# Patient Record
Sex: Female | Born: 1960 | Race: White | Hispanic: Yes | Smoking: Never smoker
Health system: Southern US, Community
[De-identification: ages and names within clinical notes are randomized; demographics above are authoritative.]

## PROBLEM LIST (undated history)

## (undated) DIAGNOSIS — J45909 Unspecified asthma, uncomplicated: Secondary | ICD-10-CM

## (undated) HISTORY — PX: BREAST BIOPSY: SHX20

## (undated) HISTORY — PX: COLONOSCOPY W/ POLYPECTOMY: SHX1380

## (undated) HISTORY — PX: BREAST REDUCTION SURGERY: SHX8

## (undated) HISTORY — PX: TUMOR REMOVAL: SHX12

## (undated) HISTORY — PX: TONSILLECTOMY: SUR1361

## (undated) HISTORY — PX: OTHER SURGICAL HISTORY: SHX169

---

## 1999-02-28 ENCOUNTER — Other Ambulatory Visit: Admission: RE | Admit: 1999-02-28 | Discharge: 1999-02-28 | Payer: Self-pay | Admitting: Obstetrics and Gynecology

## 1999-03-24 ENCOUNTER — Encounter: Payer: Self-pay | Admitting: Obstetrics and Gynecology

## 1999-03-24 ENCOUNTER — Ambulatory Visit (HOSPITAL_COMMUNITY): Admission: RE | Admit: 1999-03-24 | Discharge: 1999-03-24 | Payer: Self-pay | Admitting: Obstetrics and Gynecology

## 2000-04-21 ENCOUNTER — Other Ambulatory Visit: Admission: RE | Admit: 2000-04-21 | Discharge: 2000-04-21 | Payer: Self-pay | Admitting: Obstetrics and Gynecology

## 2000-08-16 ENCOUNTER — Encounter (INDEPENDENT_AMBULATORY_CARE_PROVIDER_SITE_OTHER): Payer: Self-pay | Admitting: Specialist

## 2000-08-16 ENCOUNTER — Ambulatory Visit (HOSPITAL_COMMUNITY): Admission: RE | Admit: 2000-08-16 | Discharge: 2000-08-16 | Payer: Self-pay | Admitting: Gastroenterology

## 2001-03-24 ENCOUNTER — Other Ambulatory Visit: Admission: RE | Admit: 2001-03-24 | Discharge: 2001-03-24 | Payer: Self-pay | Admitting: Obstetrics and Gynecology

## 2001-03-24 ENCOUNTER — Encounter (INDEPENDENT_AMBULATORY_CARE_PROVIDER_SITE_OTHER): Payer: Self-pay

## 2001-04-19 ENCOUNTER — Other Ambulatory Visit: Admission: RE | Admit: 2001-04-19 | Discharge: 2001-04-19 | Payer: Self-pay | Admitting: Obstetrics and Gynecology

## 2002-05-08 ENCOUNTER — Other Ambulatory Visit: Admission: RE | Admit: 2002-05-08 | Discharge: 2002-05-08 | Payer: Self-pay | Admitting: Obstetrics and Gynecology

## 2003-04-27 ENCOUNTER — Ambulatory Visit (HOSPITAL_COMMUNITY): Admission: RE | Admit: 2003-04-27 | Discharge: 2003-04-27 | Payer: Self-pay | Admitting: Obstetrics and Gynecology

## 2003-04-27 ENCOUNTER — Encounter: Payer: Self-pay | Admitting: Obstetrics and Gynecology

## 2003-05-25 ENCOUNTER — Other Ambulatory Visit: Admission: RE | Admit: 2003-05-25 | Discharge: 2003-05-25 | Payer: Self-pay | Admitting: Obstetrics and Gynecology

## 2003-10-27 HISTORY — PX: ABLATION: SHX5711

## 2004-02-17 ENCOUNTER — Emergency Department (HOSPITAL_COMMUNITY): Admission: EM | Admit: 2004-02-17 | Discharge: 2004-02-17 | Payer: Self-pay | Admitting: Family Medicine

## 2004-05-07 ENCOUNTER — Ambulatory Visit (HOSPITAL_COMMUNITY): Admission: RE | Admit: 2004-05-07 | Discharge: 2004-05-07 | Payer: Self-pay | Admitting: Obstetrics and Gynecology

## 2004-08-05 ENCOUNTER — Other Ambulatory Visit: Admission: RE | Admit: 2004-08-05 | Discharge: 2004-08-05 | Payer: Self-pay | Admitting: Obstetrics and Gynecology

## 2004-10-22 ENCOUNTER — Ambulatory Visit (HOSPITAL_COMMUNITY): Admission: RE | Admit: 2004-10-22 | Discharge: 2004-10-22 | Payer: Self-pay | Admitting: Obstetrics and Gynecology

## 2004-10-22 ENCOUNTER — Ambulatory Visit (HOSPITAL_BASED_OUTPATIENT_CLINIC_OR_DEPARTMENT_OTHER): Admission: RE | Admit: 2004-10-22 | Discharge: 2004-10-22 | Payer: Self-pay | Admitting: Obstetrics and Gynecology

## 2004-12-07 ENCOUNTER — Emergency Department (HOSPITAL_COMMUNITY): Admission: EM | Admit: 2004-12-07 | Discharge: 2004-12-07 | Payer: Self-pay | Admitting: Family Medicine

## 2005-06-02 ENCOUNTER — Ambulatory Visit (HOSPITAL_COMMUNITY): Admission: RE | Admit: 2005-06-02 | Discharge: 2005-06-02 | Payer: Self-pay | Admitting: Obstetrics and Gynecology

## 2006-08-03 ENCOUNTER — Ambulatory Visit (HOSPITAL_COMMUNITY): Admission: RE | Admit: 2006-08-03 | Discharge: 2006-08-03 | Payer: Self-pay | Admitting: Obstetrics and Gynecology

## 2007-08-09 ENCOUNTER — Ambulatory Visit (HOSPITAL_COMMUNITY): Admission: RE | Admit: 2007-08-09 | Discharge: 2007-08-09 | Payer: Self-pay | Admitting: Obstetrics and Gynecology

## 2007-10-27 HISTORY — PX: TUBAL LIGATION: SHX77

## 2008-08-15 ENCOUNTER — Other Ambulatory Visit: Admission: RE | Admit: 2008-08-15 | Discharge: 2008-08-15 | Payer: Self-pay | Admitting: Obstetrics and Gynecology

## 2008-08-15 ENCOUNTER — Ambulatory Visit (HOSPITAL_COMMUNITY): Admission: RE | Admit: 2008-08-15 | Discharge: 2008-08-15 | Payer: Self-pay | Admitting: Obstetrics and Gynecology

## 2008-09-17 ENCOUNTER — Ambulatory Visit (HOSPITAL_BASED_OUTPATIENT_CLINIC_OR_DEPARTMENT_OTHER): Admission: RE | Admit: 2008-09-17 | Discharge: 2008-09-17 | Payer: Self-pay | Admitting: Obstetrics and Gynecology

## 2009-08-22 ENCOUNTER — Ambulatory Visit (HOSPITAL_COMMUNITY): Admission: RE | Admit: 2009-08-22 | Discharge: 2009-08-22 | Payer: Self-pay | Admitting: Obstetrics and Gynecology

## 2010-11-10 ENCOUNTER — Ambulatory Visit (HOSPITAL_COMMUNITY)
Admission: RE | Admit: 2010-11-10 | Discharge: 2010-11-10 | Payer: Self-pay | Source: Home / Self Care | Attending: Obstetrics and Gynecology | Admitting: Obstetrics and Gynecology

## 2011-03-10 NOTE — Op Note (Signed)
[name], [name]                ACCOUNT NO.:  [id number]   MEDICAL RECORD NO.:  [id number]          PATIENT TYPE:  AMB   LOCATION:  [facility]                         FACILITY:  [facility]   PHYSICIAN:  Cynthia P. Romine, M.D.DATE OF BIRTH:  April 12, 1961   DATE OF PROCEDURE:  09/17/2008  DATE OF DISCHARGE:                               OPERATIVE REPORT   PREOPERATIVE DIAGNOSIS:  Desire for attempt at permanent surgical  sterilization.   POSTOPERATIVE DIAGNOSIS:  Desire for attempt at permanent surgical  sterilization.   PROCEDURE:  Laparoscopic Falope ring bilateral tubal sterilization.   SURGEON:  Dr. Arline Asp Romine.   ANESTHESIA:  General endotracheal.   ESTIMATED BLOOD LOSS:  Minimal.   COMPLICATIONS:  None.   PROCEDURE:  The patient was taken to the operating room, and after  induction of adequate general endotracheal anesthesia was placed in  dorsal lithotomy position and prepped and draped in the usual fashion.  A speculum was inserted.  The cervix was grasped on its anterior lip  with a single-tooth tenaculum, and a Hulka uterine manipulator was  placed.  The speculum was removed.  The bladder was drained with a red  rubber catheter.  Attention was next turned to the abdomen.  A crease  was noted vertically just below the umbilicus and that seemed to be a  good choice for the incision.  It was infiltrated with 0.25% Marcaine,  and approximately 10-mm incision was made in the skin.  A Veress needle  was inserted in the peritoneal space.  Proper placement was tested by  noting free flow of saline through the Veress needle with a negative  aspirate and then by noting the response of a drop of saline placed at  the hub of the Veress needle to negative pressure as the abdominal wall  was elevated.  Pneumoperitoneum was created with 2.5 liters of CO2 using  the automatic insufflator with a maximum pressure set on 12 mmHg.  The  disposable 11-mm trocar was then inserted and its proper  placement noted  with the microscope.  The pelvis was inspected.  The uterus appeared  normal as did the tubes and ovaries.  The anterior cul-de-sac was  normal.  In the posterior cul-de-sac on the right uterosacral ligament,  there was a small powder burn area consistent with endometriosis.  Photographic documentation was taken.  The upper abdomen appeared  normal.  The liver edge was smoothed.  Attention was next turned to the  suprapubic area.  An approximately 8-mm suprapubic incision was made  after infiltrating with 0.25% Marcaine.  The 8-mm trocar was inserted  under direct visualization.  The Falope ring applier was placed.  The  right tube was traced to its fimbriated end.  An isthmic portion was  elevated, and a Falope ring was placed.  A good knuckle of tube was  noted to be contained within the ring, and good blanching was noted.  The procedure was repeated on the patient's left, identifying the tube  and tracing it to its fimbriated end.  An isthmic portion was elevated,  and a Falope ring  was placed.  Again, a good knuckle of tube was noted  to be contained within the ring, and good blanching was noted.  Photographic documentation was taken.  The applier was then removed.  Pneumoperitoneum was allowed to escape.  The sleeves were removed, and  the incisions were closed subcuticularly  with 4-0 Vicryl Rapid and then Dermabond applied to the skin.  The  manipulator was removed from the uterus, and the procedure was  terminated.  The patient tolerated it well in satisfactory condition to  post-anesthesia recovery.  Sponge, needle, and instrument counts were  correct.      Cynthia P. Romine, M.D.  Electronically Signed     CPR/MEDQ  D:  09/17/2008  T:  09/17/2008  Job:  [id number]

## 2011-03-13 NOTE — Procedures (Signed)
[facility]  Patient:    [name], [name]                       MRN: [id number] Proc. Date: 08/16/00 Adm. Date:  [id number] Attending:  Rich Brave CC:         Dellis Anes. Idell Pickles, M.D.   Procedure Report  PROCEDURE PERFORMED:  Colonoscopy with biopsies.  ENDOSCOPIST:  Florencia Reasons, M.D.  INDICATIONS FOR PROCEDURE:  The patient is a 50 year old female with recurrent small volume hematochezia.  FINDINGS:  Distal proctitis suggestive of ulcerative proctitis.  DESCRIPTION OF PROCEDURE:  The nature, purpose and risks of the procedure had been discussed with the patient, who provided written consent.  Sedation was fentanyl 90 mcg and Versed 8 mg prior to and during the course of procedure without arrhythmias or desaturation.  Perianal and digital rectal exams were unremarkable.  The Olympus adult video colonoscope was advanced quite easily to the terminal ileum which was entered for a distance of about 10 to 12 cm, after which pullback was performed.   The quality of the prep was very good except for some pasty stool pocketed in parts of the proximal ascending colon just above the cecum.  I did try to irrigate the stool to some degree and felt that I was able to see behind most of those areas adequately, such that no significant lesions would have been missed.  The main finding on this exam was distal proctitis, characterized by erythema, decreased vascularity and some slight granularity without exudate.  This appeared to extend from the anal verge up to about 4 cm.  Above this, the rectal mucosa looked normal,  No polyps, cancer, proximal colitis, ileitis, vascular malformations or diverticular disease were observed.  Retroflexion in the rectum basically confirmed the above impression.  Biopsies were obtained from the proximal rectum for comparison and from the inflamed segment of the distal rectum prior to removal of the  scope.  Pullout through the anal canal showed some mild to moderate internal hemorrhoids, nothing particularly impressive and nothing that would readily account for the patients bleeding.  PLAN: 1. Initiate mesalamine suppositories as previously recommended. 2. Await pathology on biopsies. DD:  08/16/00 TD:  08/16/00 Job: [id number]

## 2011-03-13 NOTE — Op Note (Signed)
[name], [name]                ACCOUNT NO.:  [id number]   MEDICAL RECORD NO.:  [id number]          PATIENT TYPE:  AMB   LOCATION:  [facility]                         [facility]ACILITY:  [facility]   PHYSICIAN:  Cynthia P. Romine, M.D.DATE OF BIRTH:  1961-06-15   DATE OF PROCEDURE:  10/22/2004  DATE OF DISCHARGE:                                 OPERATIVE REPORT   PREOPERATIVE DIAGNOSES:  Menorrhagia.   POSTOPERATIVE DIAGNOSES:  Menorrhagia.   PROCEDURE:  Endometrial ablation with hydrotherm ablator.   SURGEON:  Cynthia P. Romine, M.D.   ANESTHESIA:  General by LMA.   ESTIMATED BLOOD LOSS:  Minimal.   COMPLICATIONS:  None.   DESCRIPTION OF PROCEDURE:  The patient was taken to the operating room and  after the induction of the adequate general anesthesia was placed in the  dorsal lithotomy position and prepped and draped in the usual fashion. The  bladder was drained with a red rubber catheter. A posterior weighted and  anterior Simms retractor were placed, the cervix was grasped on its anterior  lip with a single tooth tenaculum. The uterus was sounded to 8 cm. The  cervix was dilated to a #23 Shawnie Pons. The hysteroscope was introduced and  photographic documentation was taken of the endometrial cavity which  appeared clean. The scope was withdrawn to just inside the internal cervical  os and hydrotherm ablation was carried out in the usual fashion without  difficulty. Upon completion of the procedure, photographic documentation  again was taken of the endometrial cavity. The scope was withdrawn and the  procedure was terminated. The instruments were removed from the vagina and  the patient was taken to the recovery room in satisfactory condition.      CPR/MEDQ  D:  10/22/2004  T:  10/22/2004  Job:  [id number]

## 2011-07-28 LAB — CBC
HCT: 44
Hemoglobin: 14.8
MCV: 89.7
Platelets: 252
RBC: 4.9
RDW: 14.1
WBC: 7.5

## 2011-07-28 LAB — HCG, SERUM, QUALITATIVE: Preg, Serum: NEGATIVE

## 2011-11-17 ENCOUNTER — Other Ambulatory Visit: Payer: Self-pay | Admitting: Obstetrics and Gynecology

## 2011-11-17 DIAGNOSIS — Z1231 Encounter for screening mammogram for malignant neoplasm of breast: Secondary | ICD-10-CM

## 2011-11-18 ENCOUNTER — Other Ambulatory Visit: Payer: Self-pay | Admitting: Gastroenterology

## 2011-11-18 DIAGNOSIS — R634 Abnormal weight loss: Secondary | ICD-10-CM

## 2011-11-23 ENCOUNTER — Other Ambulatory Visit: Payer: Self-pay | Admitting: Gastroenterology

## 2011-12-01 ENCOUNTER — Ambulatory Visit
Admission: RE | Admit: 2011-12-01 | Discharge: 2011-12-01 | Disposition: A | Discharge status: Home / Self Care | Payer: BC Managed Care – PPO | Source: Ambulatory Visit | Attending: Gastroenterology | Admitting: Gastroenterology

## 2011-12-01 ENCOUNTER — Ambulatory Visit
Admission: RE | Admit: 2011-12-01 | Discharge: 2011-12-01 | Disposition: A | Discharge status: Home / Self Care | Payer: BC Managed Care – PPO | Source: Ambulatory Visit | Attending: Obstetrics and Gynecology | Admitting: Obstetrics and Gynecology

## 2011-12-01 DIAGNOSIS — Z1231 Encounter for screening mammogram for malignant neoplasm of breast: Secondary | ICD-10-CM

## 2011-12-01 DIAGNOSIS — R634 Abnormal weight loss: Secondary | ICD-10-CM

## 2011-12-01 MED ORDER — IOHEXOL 300 MG/ML  SOLN
100.0000 mL | Freq: Once | INTRAMUSCULAR | Status: AC | PRN
  Administered 2011-12-01: 100 mL via INTRAVENOUS

## 2011-12-08 ENCOUNTER — Other Ambulatory Visit: Payer: Self-pay | Admitting: Obstetrics and Gynecology

## 2011-12-08 DIAGNOSIS — R928 Other abnormal and inconclusive findings on diagnostic imaging of breast: Secondary | ICD-10-CM

## 2011-12-15 ENCOUNTER — Ambulatory Visit
Admission: RE | Admit: 2011-12-15 | Discharge: 2011-12-15 | Disposition: A | Discharge status: Home / Self Care | Payer: BC Managed Care – PPO | Source: Ambulatory Visit | Attending: Obstetrics and Gynecology | Admitting: Obstetrics and Gynecology

## 2011-12-15 DIAGNOSIS — R928 Other abnormal and inconclusive findings on diagnostic imaging of breast: Secondary | ICD-10-CM

## 2012-01-25 HISTORY — PX: COLPOSCOPY: SHX161

## 2012-02-15 HISTORY — PX: LEEP: SHX91

## 2012-07-27 ENCOUNTER — Ambulatory Visit: Payer: BC Managed Care – PPO | Attending: Orthopedic Surgery | Admitting: Physical Therapy

## 2012-07-27 DIAGNOSIS — M25539 Pain in unspecified wrist: Secondary | ICD-10-CM | POA: Insufficient documentation

## 2012-07-27 DIAGNOSIS — M6281 Muscle weakness (generalized): Secondary | ICD-10-CM | POA: Insufficient documentation

## 2012-07-27 DIAGNOSIS — IMO0001 Reserved for inherently not codable concepts without codable children: Secondary | ICD-10-CM | POA: Insufficient documentation

## 2012-08-02 ENCOUNTER — Ambulatory Visit: Payer: BC Managed Care – PPO | Admitting: Physical Therapy

## 2012-08-10 ENCOUNTER — Ambulatory Visit: Payer: BC Managed Care – PPO | Admitting: Physical Therapy

## 2012-08-17 ENCOUNTER — Ambulatory Visit: Payer: BC Managed Care – PPO | Admitting: Physical Therapy

## 2012-08-24 ENCOUNTER — Encounter: Payer: BC Managed Care – PPO | Admitting: Physical Therapy

## 2012-08-31 ENCOUNTER — Ambulatory Visit: Payer: BC Managed Care – PPO | Attending: Orthopedic Surgery | Admitting: Physical Therapy

## 2012-08-31 DIAGNOSIS — M25539 Pain in unspecified wrist: Secondary | ICD-10-CM | POA: Insufficient documentation

## 2012-08-31 DIAGNOSIS — IMO0001 Reserved for inherently not codable concepts without codable children: Secondary | ICD-10-CM | POA: Insufficient documentation

## 2012-08-31 DIAGNOSIS — M6281 Muscle weakness (generalized): Secondary | ICD-10-CM | POA: Insufficient documentation

## 2012-09-07 ENCOUNTER — Encounter: Payer: BC Managed Care – PPO | Admitting: Physical Therapy

## 2012-11-22 ENCOUNTER — Other Ambulatory Visit: Payer: Self-pay | Admitting: Obstetrics and Gynecology

## 2012-11-22 DIAGNOSIS — Z1231 Encounter for screening mammogram for malignant neoplasm of breast: Secondary | ICD-10-CM

## 2012-12-08 ENCOUNTER — Ambulatory Visit: Payer: BC Managed Care – PPO

## 2012-12-09 ENCOUNTER — Ambulatory Visit (INDEPENDENT_AMBULATORY_CARE_PROVIDER_SITE_OTHER): Payer: BC Managed Care – PPO

## 2012-12-09 DIAGNOSIS — Z1231 Encounter for screening mammogram for malignant neoplasm of breast: Secondary | ICD-10-CM

## 2013-01-16 ENCOUNTER — Encounter: Payer: Self-pay | Admitting: Certified Nurse Midwife

## 2013-01-17 ENCOUNTER — Ambulatory Visit (INDEPENDENT_AMBULATORY_CARE_PROVIDER_SITE_OTHER): Payer: BC Managed Care – PPO | Admitting: Certified Nurse Midwife

## 2013-01-17 ENCOUNTER — Encounter: Payer: Self-pay | Admitting: Certified Nurse Midwife

## 2013-01-17 ENCOUNTER — Other Ambulatory Visit: Payer: Self-pay | Admitting: Certified Nurse Midwife

## 2013-01-17 VITALS — BP 110/64

## 2013-01-17 DIAGNOSIS — N898 Other specified noninflammatory disorders of vagina: Secondary | ICD-10-CM

## 2013-01-17 DIAGNOSIS — B977 Papillomavirus as the cause of diseases classified elsewhere: Secondary | ICD-10-CM

## 2013-01-17 DIAGNOSIS — N760 Acute vaginitis: Secondary | ICD-10-CM

## 2013-01-17 NOTE — Patient Instructions (Addendum)

## 2013-01-18 ENCOUNTER — Ambulatory Visit: Payer: Self-pay | Admitting: Certified Nurse Midwife

## 2013-01-18 DIAGNOSIS — N760 Acute vaginitis: Secondary | ICD-10-CM | POA: Insufficient documentation

## 2013-01-18 DIAGNOSIS — B977 Papillomavirus as the cause of diseases classified elsewhere: Secondary | ICD-10-CM | POA: Insufficient documentation

## 2013-01-18 LAB — POCT WET PREP (WET MOUNT)

## 2013-01-18 NOTE — Procedures (Signed)
52 y.o. Divorced [place] female  OB History   Grav Para Term Preterm Abortions TAB SAB Ect Mult Living   3 1 1  2 2    1      here for colposcopy. 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  Contraception:  bilateral tubal ligation   Blood pressure 110/64, last menstrual period 01/07/2013.  Procedure explained and patient's questions were invited and answered.  Consent form signed.    Role of HPV in genesis of SIL discussed with patient, and questions answered.   Speculum inserted atraumatically and cervix visualized. Wet prep obtained.  Saline wash applied.  3% acetic acid applied with acetowhite effect noted . Cervix examined using 3.75,7.5,15  X magnification and green filter.    Gross appearance:Leep appearance no other abnormal appearance  Squamocolumnar junction seen in entirety:yes  Extent of lesion entirely seen: yes   acetowhite lesion(s) noted at 6 and 1 o'clock cervix swabbed with Lugol's solution, biopsy taken at 6 and 1 o'clock endocervical curretage obtained. Monsels applied for hemostasis.  Specimens labeled and sent to pathology Patient tolerated procedure well.  Impression: Questionable HPV effect will await pathology report Wet prep revealed positive clue cells  Plan:  Will base further treatment on Pathology findings. Rx Tindamax 500mg  po bid x 5 days no refills with instructions and warning signs and symptoms  Post biopsy instructions and AVS given to patient.

## 2013-01-26 ENCOUNTER — Telehealth: Payer: Self-pay | Admitting: Certified Nurse Midwife

## 2013-01-26 NOTE — Telephone Encounter (Signed)
Patient is calling to find out her results from her biopsy completed on 3/25. Please call the patient.

## 2013-01-26 NOTE — Telephone Encounter (Signed)
PT IS STILL WAITING FOR A RETURN CALL WITH HER RESULTS PT WANTS TO HAVE A CALL BEFORE THE DAY IS OVER. SHE STATES SHE HAS BEEN WAITING FOR 7 DAYS

## 2013-01-26 NOTE — Telephone Encounter (Signed)
Colpo appt was 01-17-13 at 4pm.  Results not seen in EPIC, Call to pt and LM that no results yet available, apologized for delay and will call back early am.

## 2013-01-27 ENCOUNTER — Encounter: Payer: Self-pay | Admitting: Certified Nurse Midwife

## 2013-01-27 NOTE — Telephone Encounter (Signed)
Debbi called patient personally.

## 2013-11-22 ENCOUNTER — Other Ambulatory Visit: Payer: Self-pay

## 2014-01-04 ENCOUNTER — Ambulatory Visit: Payer: Self-pay | Admitting: Certified Nurse Midwife

## 2014-08-27 ENCOUNTER — Encounter: Payer: Self-pay | Admitting: Certified Nurse Midwife

## 2014-11-06 ENCOUNTER — Emergency Department (HOSPITAL_COMMUNITY)
Admission: EM | Admit: 2014-11-06 | Discharge: 2014-11-06 | Disposition: A | Discharge status: Home / Self Care | Payer: BC Managed Care – PPO | Attending: Emergency Medicine | Admitting: Emergency Medicine

## 2014-11-06 ENCOUNTER — Emergency Department (HOSPITAL_COMMUNITY): Payer: BC Managed Care – PPO

## 2014-11-06 ENCOUNTER — Encounter (HOSPITAL_COMMUNITY): Payer: Self-pay

## 2014-11-06 DIAGNOSIS — R197 Diarrhea, unspecified: Secondary | ICD-10-CM

## 2014-11-06 DIAGNOSIS — R195 Other fecal abnormalities: Secondary | ICD-10-CM | POA: Insufficient documentation

## 2014-11-06 DIAGNOSIS — Z79899 Other long term (current) drug therapy: Secondary | ICD-10-CM | POA: Diagnosis not present

## 2014-11-06 DIAGNOSIS — Z3202 Encounter for pregnancy test, result negative: Secondary | ICD-10-CM | POA: Insufficient documentation

## 2014-11-06 DIAGNOSIS — J45909 Unspecified asthma, uncomplicated: Secondary | ICD-10-CM | POA: Insufficient documentation

## 2014-11-06 DIAGNOSIS — R42 Dizziness and giddiness: Secondary | ICD-10-CM | POA: Diagnosis present

## 2014-11-06 HISTORY — DX: Unspecified asthma, uncomplicated: J45.909

## 2014-11-06 LAB — CBC WITH DIFFERENTIAL/PLATELET
BASOS ABS: 0 10*3/uL (ref 0.0–0.1)
Basophils Relative: 0 % (ref 0–1)
Eosinophils Absolute: 0 10*3/uL (ref 0.0–0.7)
Eosinophils Relative: 0 % (ref 0–5)
HCT: 49 % — ABNORMAL HIGH (ref 36.0–46.0)
HEMOGLOBIN: 16.6 g/dL — AB (ref 12.0–15.0)
LYMPHS ABS: 1.2 10*3/uL (ref 0.7–4.0)
LYMPHS PCT: 20 % (ref 12–46)
MCH: 31.1 pg (ref 26.0–34.0)
MCHC: 33.9 g/dL (ref 30.0–36.0)
MCV: 91.9 fL (ref 78.0–100.0)
Monocytes Absolute: 0.5 10*3/uL (ref 0.1–1.0)
Monocytes Relative: 8 % (ref 3–12)
NEUTROS ABS: 4.4 10*3/uL (ref 1.7–7.7)
NEUTROS PCT: 72 % (ref 43–77)
PLATELETS: 214 10*3/uL (ref 150–400)
RBC: 5.33 MIL/uL — AB (ref 3.87–5.11)
RDW: 13.9 % (ref 11.5–15.5)
WBC: 6.2 10*3/uL (ref 4.0–10.5)

## 2014-11-06 LAB — COMPREHENSIVE METABOLIC PANEL
ALBUMIN: 3.9 g/dL (ref 3.5–5.2)
ALK PHOS: 65 U/L (ref 39–117)
ALT: 23 U/L (ref 0–35)
ANION GAP: 6 (ref 5–15)
AST: 25 U/L (ref 0–37)
BILIRUBIN TOTAL: 0.5 mg/dL (ref 0.3–1.2)
BUN: 19 mg/dL (ref 6–23)
CO2: 24 mmol/L (ref 19–32)
CREATININE: 0.84 mg/dL (ref 0.50–1.10)
Calcium: 8.9 mg/dL (ref 8.4–10.5)
Chloride: 108 mEq/L (ref 96–112)
GFR calc non Af Amer: 79 mL/min — ABNORMAL LOW (ref >90)
GLUCOSE: 79 mg/dL (ref 70–99)
POTASSIUM: 4 mmol/L (ref 3.5–5.1)
Sodium: 138 mmol/L (ref 135–145)
TOTAL PROTEIN: 6.9 g/dL (ref 6.0–8.3)

## 2014-11-06 LAB — PREGNANCY, URINE: PREG TEST UR: NEGATIVE

## 2014-11-06 LAB — LIPASE, BLOOD: Lipase: 24 U/L (ref 11–59)

## 2014-11-06 MED ORDER — SODIUM CHLORIDE 0.9 % IV BOLUS (SEPSIS)
1000.0000 mL | Freq: Once | INTRAVENOUS | Status: AC
  Administered 2014-11-06: 1000 mL via INTRAVENOUS

## 2014-11-06 MED ORDER — IOHEXOL 300 MG/ML  SOLN
100.0000 mL | Freq: Once | INTRAMUSCULAR | Status: AC | PRN
  Administered 2014-11-06: 100 mL via INTRAVENOUS

## 2014-11-06 MED ORDER — ONDANSETRON HCL 4 MG/2ML IJ SOLN
4.0000 mg | Freq: Once | INTRAMUSCULAR | Status: AC
  Administered 2014-11-06: 4 mg via INTRAVENOUS
  Filled 2014-11-06: qty 2

## 2014-11-06 MED ORDER — IOHEXOL 300 MG/ML  SOLN
25.0000 mL | Freq: Once | INTRAMUSCULAR | Status: AC | PRN
  Administered 2014-11-06: 25 mL via ORAL

## 2014-11-06 NOTE — ED Notes (Signed)
CT notified patient is finished PO contrast.

## 2014-11-06 NOTE — ED Provider Notes (Signed)
CSN: [id number]     Arrival date & time 11/06/14  1012 History   First MD Initiated Contact with Patient 11/06/14 1028     Chief Complaint  Patient presents with  . Emesis  . Dizziness      HPI Pt complains of diarrhea(7-10 stools per day) and dizziness x 9 days, Seen at PCP yesterday and had blood work done which was all negative but had cloudy urine Past Medical History  Diagnosis Date  . Asthma    Past Surgical History  Procedure Laterality Date  . Tubal ligation  2009  . Leep  02/15/2012    cin 2  . Colposcopy  01/25/2012    cin 2  . Breast reduction surgery    . Breast biopsy Left   . Colonoscopy w/ polypectomy    . Tonsillectomy    . Tumor removal Left     left arm  . Ablation  2005  . Endometrial biopsy     Family History  Problem Relation Age of Onset  . Heart disease Father   . Cancer Maternal Grandfather   . Heart disease Paternal Grandfather    History  Substance Use Topics  . Smoking status: Never Smoker   . Smokeless tobacco: Not on file  . Alcohol Use: No   OB History    Gravida Para Term Preterm AB TAB SAB Ectopic Multiple Living   Review of Systems  All other systems reviewed and are negative  Allergies  Morphine and related and Sulfa antibiotics  Home Medications   Prior to Admission medications   Medication Sig Start Date End Date Taking? Authorizing Provider  Cholecalciferol (VITAMIN D) 1000 UNITS capsule Take 1,000 Units by mouth daily.   Yes Historical Provider, MD  eletriptan (RELPAX) 40 MG tablet Take 40 mg by mouth as needed for migraine or headache. One tablet by mouth at onset of headache. May repeat in 2 hours if headache persists or recurs.   Yes Historical Provider, MD  ibuprofen (ADVIL,MOTRIN) 800 MG tablet as needed.  01/13/13  Yes Historical Provider, MD  Lecithin 1000 MG CHEW Chew 1 tablet by mouth daily.   Yes Historical Provider, MD  Loperamide HCl (IMODIUM A-D PO) Take 2 tablets by mouth once.   Yes  Historical Provider, MD  meclizine (ANTIVERT) 12.5 MG tablet Take 12.5 mg by mouth 3 (three) times daily as needed for dizziness.   Yes Historical Provider, MD  Melatonin 5 MG TABS Take 1 tablet by mouth at bedtime.   Yes Historical Provider, MD  Suvorexant (BELSOMRA) 15 MG TABS Take 15 mg by mouth at bedtime. 09/18/14  Yes Historical Provider, MD  topiramate (TOPAMAX) 200 MG tablet Take 200 mg by mouth daily.   Yes Historical Provider, MD  venlafaxine XR (EFFEXOR XR) 75 MG 24 hr capsule Take 75 mg by mouth daily. 09/17/14 09/17/15 Yes Historical Provider, MD  vitamin E 400 UNIT capsule Take 800 Units by mouth daily.   Yes Historical Provider, MD   BP 128/75 mmHg  Pulse 58  Temp(Src) 98.3 F (36.8 C) (Oral)  Resp 16  SpO2 100% Physical Exam Physical Exam  Nursing note and vitals reviewed. Constitutional: She is oriented to person, place, and time. She appears well-developed and well-nourished. No distress.  HENT:  Head: Normocephalic and atraumatic.  Eyes: Pupils are equal, round, and reactive to light.  Neck: Normal range of motion.  Cardiovascular: Normal rate  and intact distal pulses.   Pulmonary/Chest: No respiratory distress.  Abdominal: Normal appearance. She exhibits no distension.  Musculoskeletal: Normal range of motion.  Neurological: She is alert and oriented to person, place, and time. No cranial nerve deficit.  Skin: Skin is warm and dry. No rash noted.  Psychiatric: She has a normal mood and affect. Her behavior is normal.   ED Course  Procedures (including critical care time)  Medications  sodium chloride 0.9 % bolus 1,000 mL (not administered)  ondansetron (ZOFRAN) injection 4 mg (4 mg Intravenous Given 11/06/14 1139)  sodium chloride 0.9 % bolus 1,000 mL (0 mLs Intravenous Stopped 11/06/14 1220)  iohexol (OMNIPAQUE) 300 MG/ML solution 25 mL (25 mLs Oral Contrast Given 11/06/14 1109)  iohexol (OMNIPAQUE) 300 MG/ML solution 100 mL (100 mLs Intravenous Contrast Given  11/06/14 1348)    Labs Review Labs Reviewed  CBC WITH DIFFERENTIAL - Abnormal; Notable for the following:    RBC 5.33 (*)    Hemoglobin 16.6 (*)    HCT 49.0 (*)    All other components within normal limits  COMPREHENSIVE METABOLIC PANEL - Abnormal; Notable for the following:    GFR calc non Af Amer 79 (*)    All other components within normal limits  LIPASE, BLOOD  PREGNANCY, URINE    Imaging Review Ct Abdomen Pelvis W Contrast  11/06/2014   CLINICAL DATA:  Bloody diarrhea.  EXAM: CT ABDOMEN AND PELVIS WITH CONTRAST  TECHNIQUE: Multidetector CT imaging of the abdomen and pelvis was performed using the standard protocol following bolus administration of intravenous contrast.  CONTRAST:  100mL OMNIPAQUE IOHEXOL 300 MG/ML  SOLN  COMPARISON:  CT scan of December 01, 2011.  FINDINGS: Mild degenerative disc disease is noted at L5-S1. Visualized lung bases appear normal.  No gallstones are noted. Stable small left hepatic cyst is noted. No other significant abnormality is noted in the liver, spleen or pancreas. Adrenal glands appear normal. Bilateral nonobstructive nephrolithiasis is noted. Bilateral parapelvic cysts are noted. No hydronephrosis or renal obstruction is noted. No ureteral calculi are noted. The appendix appears normal. There is no evidence of bowel obstruction. No abnormal fluid collection is noted. The abdominal aorta appears normal. Urinary bladder and uterus appear normal. Ovaries appear normal. No significant adenopathy is noted.  IMPRESSION: Bilateral nonobstructive nephrolithiasis is noted. No hydronephrosis or renal obstruction is noted. No other significant abnormality is seen in the abdomen or pelvis.   Electronically Signed   By: Roque LiasJames  Green M.D.   On: 11/06/2014 14:30    Patient stable for discharge.  Will follow-up with Dr. Lamount CrankerBraccini.  MDM   Final diagnoses:  Bloody diarrhea        Nelia Shiobert L Byron Peacock, MD 11/06/14 1511

## 2014-11-06 NOTE — Discharge Instructions (Signed)
Bloody Diarrhea °Bloody diarrhea can be caused by many different conditions. Most of the time bloody diarrhea is the result of food poisoning or minor infections. Bloody diarrhea usually improves over 2 to 3 days of rest and fluid replacement. Other conditions that can cause bloody diarrhea include: °· Internal bleeding. °· Infection. °· Diseases of the bowel and colon. °Internal bleeding from an ulcer or bowel disease can be severe and requires hospital care or even surgery. °DIAGNOSIS  °To find out what is wrong your caregiver may check your: °· Stool. °· Blood. °· Results from a test that looks inside the body (endoscopy). °TREATMENT  °· Get plenty of rest. °· Drink enough water and fluids to keep your urine clear or pale yellow. °· Do not smoke. °· Solid foods and dairy products should be avoided until your illness improves. °· As you improve, slowly return to a regular diet with easily-digested foods first. Examples are: °¨ Bananas. °¨ Rice. °¨ Toast. °¨ Crackers. °You should only need these for about 2 days before adding more normal foods to your diet. °· Avoid spicy or fatty foods as well as caffeine and alcohol for several days. °· Medicine to control cramping and diarrhea can relieve symptoms but may prolong some cases of bloody diarrhea. Antibiotics can speed recovery from diarrhea due to some bacterial infections. Call your caregiver if diarrhea does not get better in 3 days. °SEEK MEDICAL CARE IF:  °· You do not improve after 3 days. °· Your diarrhea improves but your stool appears black. °SEEK IMMEDIATE MEDICAL CARE IF:  °· You become extremely weak or faint. °· You become very sweaty. °· You have increased pain or bleeding. °· You develop repeated vomiting. °· You vomit and you see blood or the vomit looks black in color. °· You have a fever. °Document Released: 10/12/2005 Document Revised: 01/04/2012 Document Reviewed: 09/13/2009 °Exit[employer]® Patient Information ©2015 [employer]. This information is  not intended to replace advice given to you by your health care provider. Make sure you discuss any questions you have with your health care provider. ° °

## 2014-11-06 NOTE — ED Notes (Signed)
Pt verbalized understanding of follow up with gastroenterologist. No further questions.

## 2014-11-06 NOTE — ED Notes (Signed)
Pt complains of diarrhea(7-10 stools per day) and dizziness x 9 days, Seen at PCP yesterday and had blood work done which was all negative but had cloudy urine.

## 2015-06-29 IMAGING — CT CT ABD-PELV W/ CM
2 of 4 series · 11 of 46 positions shown, 12 images · IV contrast (Iodine)
Comparison: CT scan of December 01, 2011.

CLINICAL DATA: Bloody diarrhea.

EXAM:
CT ABDOMEN AND PELVIS WITH CONTRAST
TECHNIQUE: Multidetector CT imaging of the abdomen and pelvis was performed
using the standard protocol following bolus administration of
intravenous contrast.
CONTRAST:  100mL OMNIPAQUE IOHEXOL 300 MG/ML  SOLN

[Series 201: routine, idose (2) · axial · 0.78mm/px · z∈[-431,-51]mm · 8 of 94 slices shown, 9 images]
[im 9/94  soft-tissue]
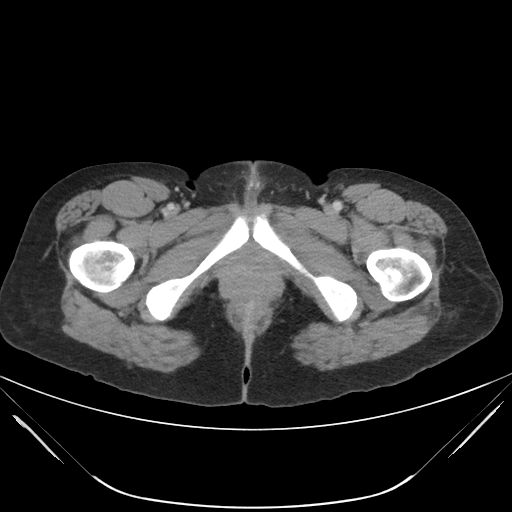
[im 9/94  bone]
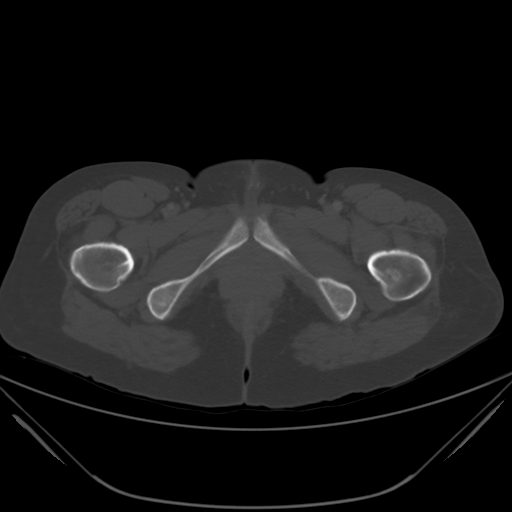
[im 18/94  soft-tissue]
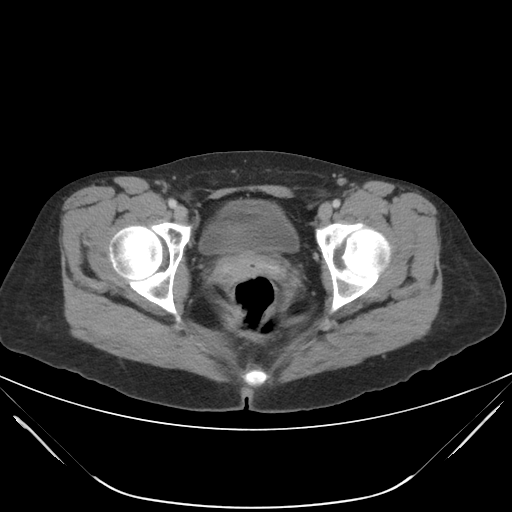
[im 32/94  soft-tissue]
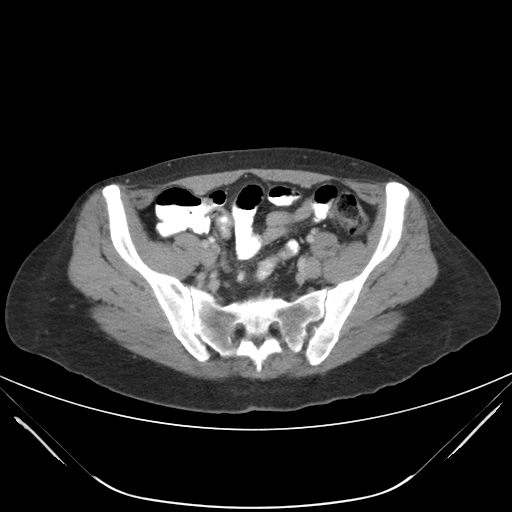
[im 40/94  soft-tissue]
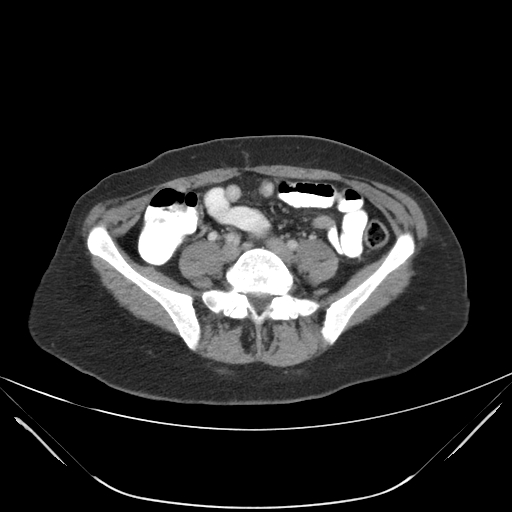
[im 54/94  soft-tissue]
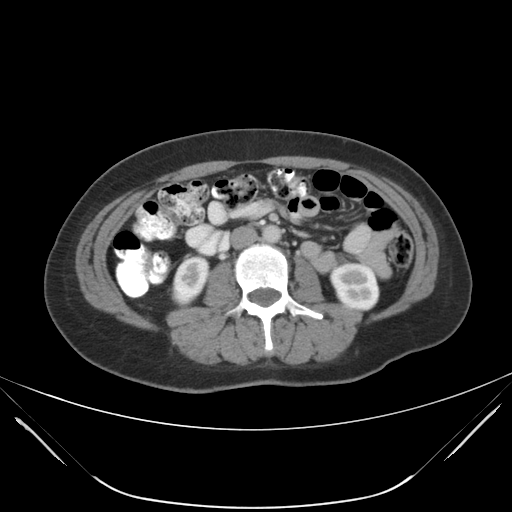
[im 63/94  soft-tissue]
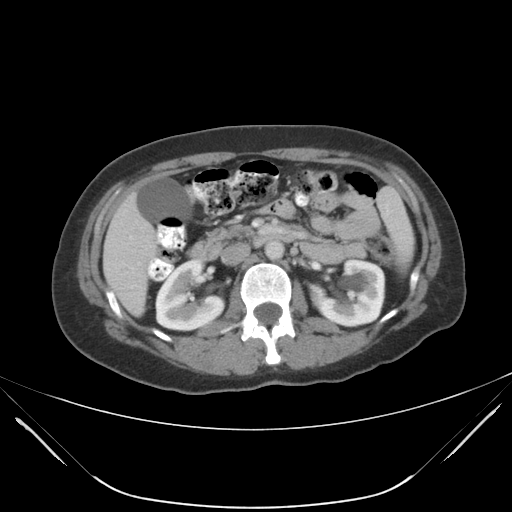
[im 76/94  soft-tissue]
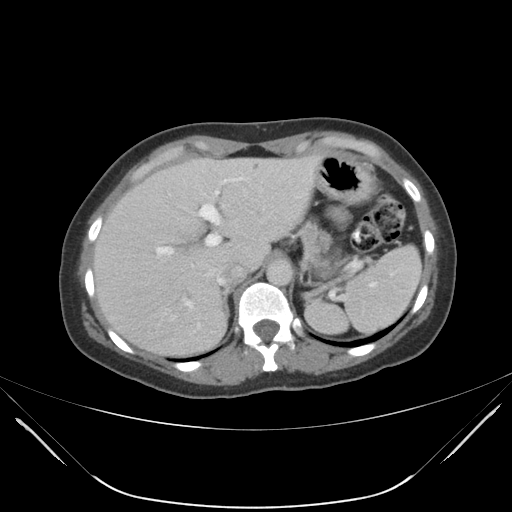
[im 85/94  soft-tissue]
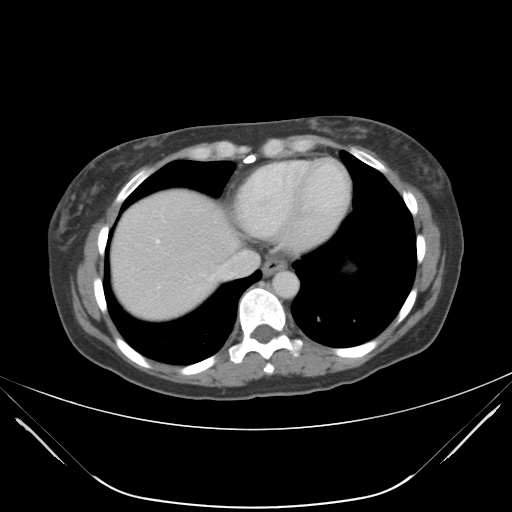

[Series 202: coronals, idose (2) · coronal · 0.45mm/px · 3 of 119 slices shown]
[im 40/119  soft-tissue]
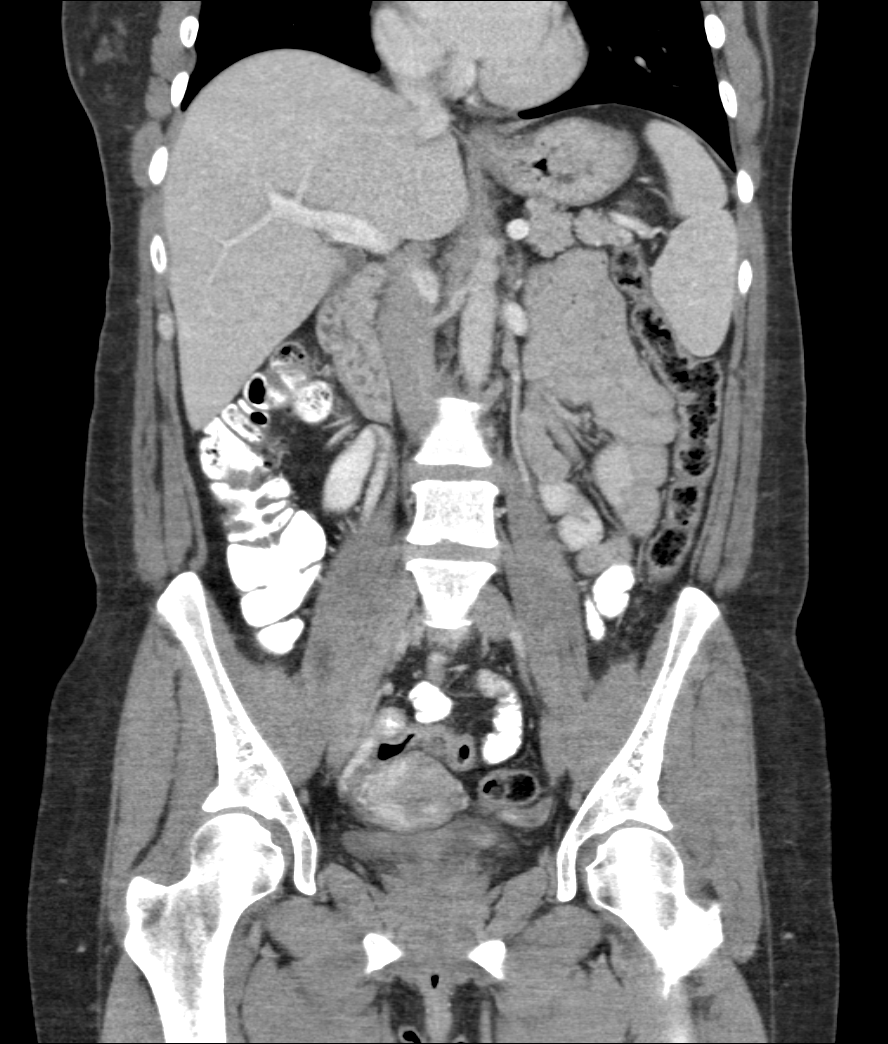
[im 53/119  soft-tissue]
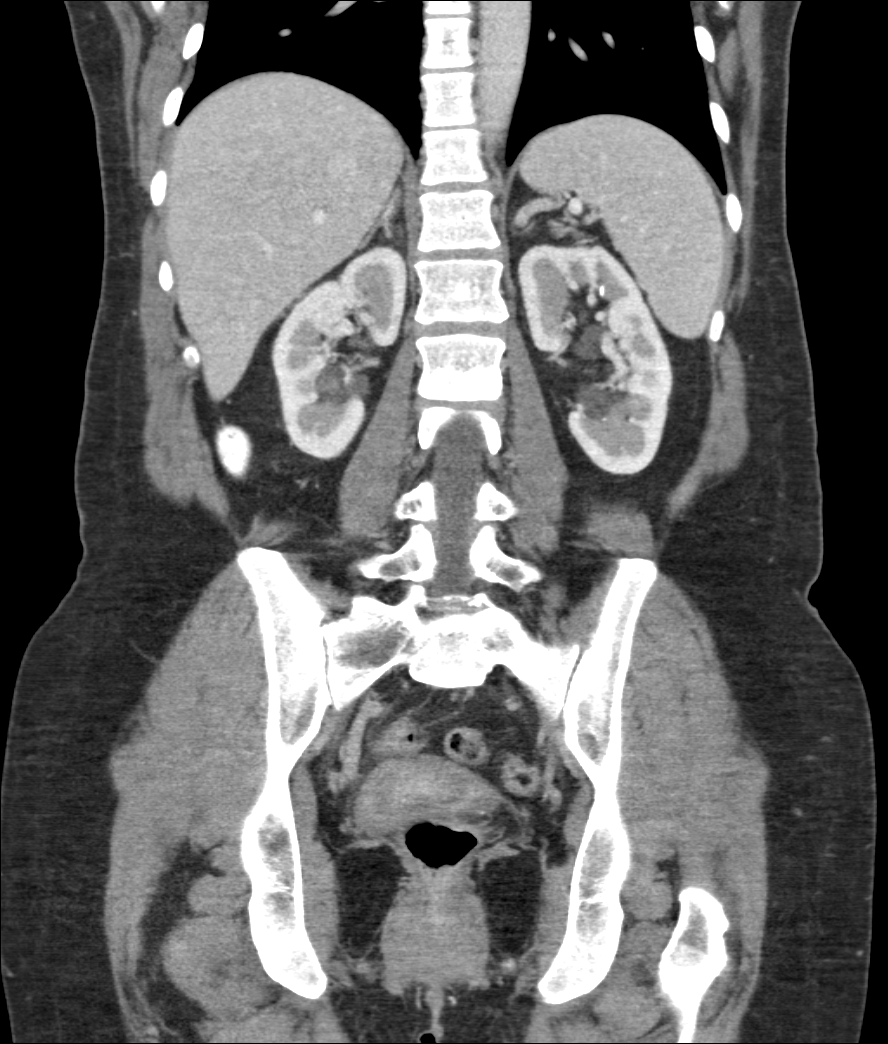
[im 66/119  soft-tissue]
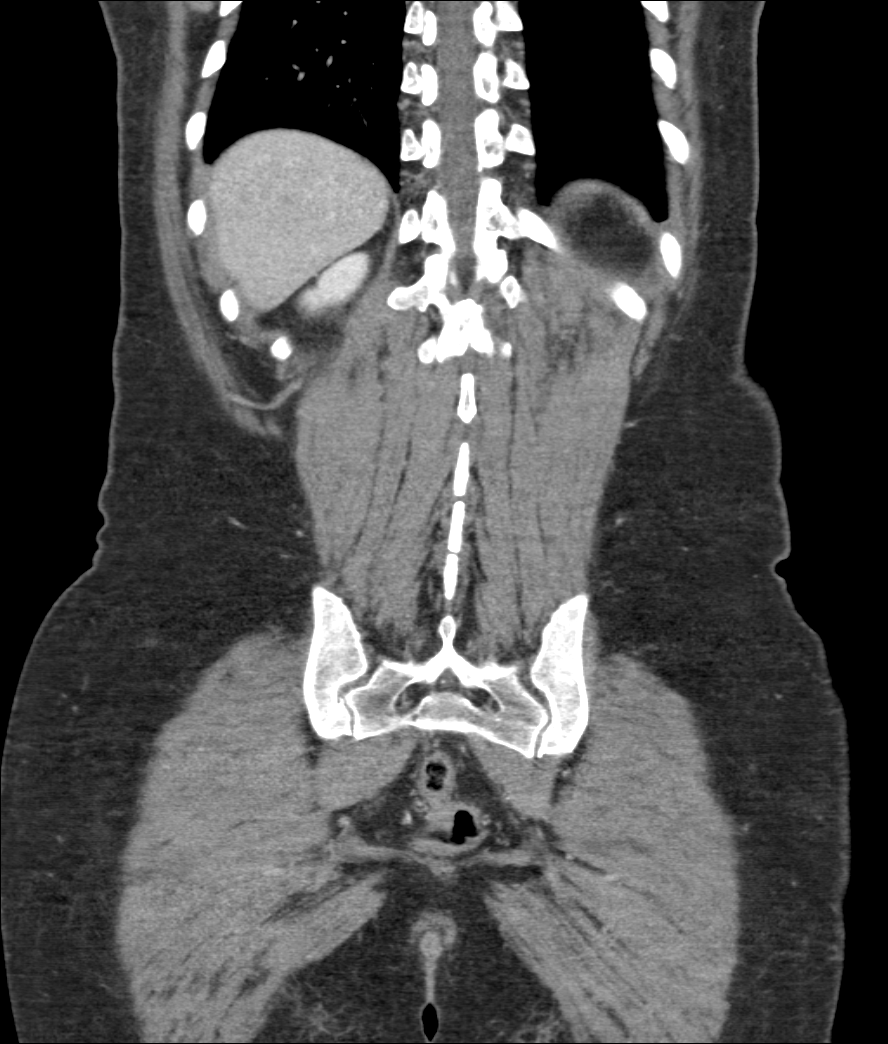

[11 of 46 positions shown; findings below may reference images not displayed]

FINDINGS: Mild degenerative disc disease is noted at L5-S1. Visualized lung
bases appear normal.

No gallstones are noted. Stable small left hepatic cyst is noted. No
other significant abnormality is noted in the liver, spleen or
pancreas. Adrenal glands appear normal. Bilateral nonobstructive
nephrolithiasis is noted. Bilateral parapelvic cysts are noted. No
hydronephrosis or renal obstruction is noted. No ureteral calculi
are noted. The appendix appears normal. There is no evidence of
bowel obstruction. No abnormal fluid collection is noted. The
abdominal aorta appears normal. Urinary bladder and uterus appear
normal. Ovaries appear normal. No significant adenopathy is noted.
IMPRESSION: Bilateral nonobstructive nephrolithiasis is noted. No hydronephrosis
or renal obstruction is noted. No other significant abnormality is
seen in the abdomen or pelvis.

## 2015-07-09 DIAGNOSIS — Z780 Asymptomatic menopausal state: Secondary | ICD-10-CM | POA: Insufficient documentation

## 2017-01-11 DIAGNOSIS — M51369 Other intervertebral disc degeneration, lumbar region without mention of lumbar back pain or lower extremity pain: Secondary | ICD-10-CM | POA: Insufficient documentation

## 2017-01-11 DIAGNOSIS — M5136 Other intervertebral disc degeneration, lumbar region: Secondary | ICD-10-CM | POA: Insufficient documentation

## 2017-09-23 DIAGNOSIS — F419 Anxiety disorder, unspecified: Secondary | ICD-10-CM | POA: Insufficient documentation

## 2017-11-15 ENCOUNTER — Ambulatory Visit (HOSPITAL_COMMUNITY): Payer: BC Managed Care – PPO | Admitting: Licensed Clinical Social Worker

## 2017-11-15 DIAGNOSIS — F419 Anxiety disorder, unspecified: Secondary | ICD-10-CM | POA: Diagnosis not present

## 2017-11-16 ENCOUNTER — Encounter (HOSPITAL_COMMUNITY): Payer: Self-pay | Admitting: Licensed Clinical Social Worker
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
## 2023-02-26 ENCOUNTER — Other Ambulatory Visit: Payer: Self-pay | Admitting: Neurological Surgery

## 2023-02-26 DIAGNOSIS — G959 Disease of spinal cord, unspecified: Secondary | ICD-10-CM

## 2023-02-26 DIAGNOSIS — M5416 Radiculopathy, lumbar region: Secondary | ICD-10-CM

## 2023-02-27 ENCOUNTER — Ambulatory Visit (INDEPENDENT_AMBULATORY_CARE_PROVIDER_SITE_OTHER): Payer: BC Managed Care – PPO

## 2023-02-27 DIAGNOSIS — M542 Cervicalgia: Secondary | ICD-10-CM

## 2023-02-27 DIAGNOSIS — M5416 Radiculopathy, lumbar region: Secondary | ICD-10-CM | POA: Diagnosis not present

## 2023-02-27 DIAGNOSIS — G959 Disease of spinal cord, unspecified: Secondary | ICD-10-CM

## 2024-02-16 ENCOUNTER — Ambulatory Visit: Admitting: Diagnostic Neuroimaging

## 2024-02-16 ENCOUNTER — Encounter: Payer: Self-pay | Admitting: Diagnostic Neuroimaging

## 2024-02-16 VITALS — BP 158/84 | HR 60 | Ht 65.0 in | Wt 156.0 lb

## 2024-02-16 DIAGNOSIS — R519 Headache, unspecified: Secondary | ICD-10-CM

## 2024-02-16 MED ORDER — BACLOFEN 10 MG PO TABS: 5.0000 mg | ORAL_TABLET | Freq: Three times a day (TID) | ORAL | 3 refills | Status: AC | PRN

## 2024-02-16 NOTE — Patient Instructions (Signed)
 LEFT FACIAL PAIN (since ~February 06, 2024) - check MRI brain, CN5 protocol, labs - continue CBZ 200mg  three times a day --> check labs, then consider adjusting dosing - check baclofen  5-10mg  three times a day as needed

## 2024-02-16 NOTE — Progress Notes (Addendum)
 [facility]  PATIENT: [name] DOB: 05/24/1961  REFERRING CLINICIAN: Nash Bade, MD HISTORY FROM: patient  REASON FOR VISIT: new consult   HISTORICAL  CHIEF COMPLAINT:  Chief Complaint  Patient presents with   Facial Pain    Rm 7 with sister [name] Pt is well, reports she has been having L sided facial pain since 4/13. She has also experienced facial droop, nausea and blurred vision, facial/scalp sensitivity.       HISTORY OF PRESENT ILLNESS:   63 year old female here for evaluation of left facial pain.  Symptom started on 02/06/24.  Having intermittent left temporal, left jaw and left ear pain with sharp sensations lasting a couple seconds at a time.  Associated with nausea, difficulty seeing.  Has been to PCP and started on carbamazepine  2 mg 3 times a day with slight improvement.  Has remote history of migraine headaches, managed with topiramate 200 mg daily.   REVIEW OF SYSTEMS: Full 14 system review of systems performed and negative with exception of: as per HPI.  ALLERGIES: Allergies  Allergen Reactions   Morphine And Codeine    Sulfa Antibiotics     HOME MEDICATIONS: Outpatient Medications Prior to Visit  Medication Sig Dispense Refill   Biotin 78295 MCG TABS Take by mouth once.     buPROPion (WELLBUTRIN XL) 300 MG 24 hr tablet   3   carbamazepine  (CARBATROL ) 300 MG 12 hr capsule Take 300 mg by mouth 2 (two) times daily.     Cholecalciferol (VITAMIN D) 1000 UNITS capsule Take 1,000 Units by mouth daily.     eletriptan (RELPAX) 40 MG tablet Take 40 mg by mouth as needed for migraine or headache. One tablet by mouth at onset of headache. May repeat in 2 hours if headache persists or recurs.     escitalopram (LEXAPRO) 10 MG tablet Take 10 mg by mouth daily.     estrogen, conjugated,-medroxyprogesterone (PREMPRO) 0.3-1.5 MG tablet Take 1 tablet by mouth daily.     ibuprofen (ADVIL,MOTRIN) 800 MG tablet as needed.      simvastatin  (ZOCOR) 20 MG tablet Take 20 mg by mouth daily.     topiramate (TOPAMAX) 200 MG tablet TAKE 1 TABLET(200 MG) BY MOUTH DAILY     vitamin E 400 UNIT capsule Take 800 Units by mouth daily.     topiramate (TOPAMAX) 200 MG tablet Take 200 mg by mouth daily.     dicyclomine (BENTYL) 10 MG capsule TK 1 C PO TID PRF ABD PAIN  3   Lecithin 1000 MG CHEW Chew 1 tablet by mouth daily.     Loperamide HCl (IMODIUM A-D PO) Take 2 tablets by mouth once.     meclizine (ANTIVERT) 12.5 MG tablet Take 12.5 mg by mouth 3 (three) times daily as needed for dizziness.     Melatonin 5 MG TABS Take 1 tablet by mouth at bedtime.     Suvorexant (BELSOMRA) 15 MG TABS Take 15 mg by mouth at bedtime.     venlafaxine XR (EFFEXOR XR) 75 MG 24 hr capsule Take 75 mg by mouth daily.     No facility-administered medications prior to visit.    PAST MEDICAL HISTORY: Past Medical History:  Diagnosis Date   Asthma     PAST SURGICAL HISTORY: Past Surgical History:  Procedure Laterality Date   ABLATION  2005   BREAST BIOPSY Left    BREAST REDUCTION SURGERY     COLONOSCOPY W/ POLYPECTOMY     COLPOSCOPY  01/25/2012   cin 2   endometrial biopsy     LEEP  02/15/2012   cin 2   TONSILLECTOMY     TUBAL LIGATION  2009   TUMOR REMOVAL Left    left arm    FAMILY HISTORY: Family History  Problem Relation Age of Onset   Heart disease Father    Cancer Maternal Grandfather    Heart disease Paternal Grandfather     SOCIAL HISTORY: Social History   Socioeconomic History   Marital status: Legally Separated    Spouse name: Not on file   Number of children: Not on file   Years of education: Not on file   Highest education level: Not on file  Occupational History   Not on file  Tobacco Use   Smoking status: Never   Smokeless tobacco: Not on file  Substance and Sexual Activity   Alcohol use: No   Drug use: No   Sexual activity: Yes    Partners: Male  Other Topics Concern   Not on file  Social History Narrative    Not on file   Social Drivers of Health   Financial Resource Strain: Low Risk  (01/27/2024)   Received from [facility]   Overall Financial Resource Strain (CARDIA)    Difficulty of Paying Living Expenses: Not very hard  Food Insecurity: No Food Insecurity (01/27/2024)   [facility] from [facility]   Hunger Vital Sign    Worried About Running Out of Food in the Last Year: Never true    Ran Out of Food in the Last Year: Never true  Transportation Needs: No Transportation Needs (01/27/2024)   Received from [facility] - Transportation    Lack of Transportation (Medical): No    Lack of Transportation (Non-Medical): No  Physical Activity: Unknown (04/20/2023)   Received from [facility]   Exercise Vital Sign    Days of Exercise per Week: 0 days    Minutes of Exercise per Session: Not on file  Stress: Stress Concern Present (04/20/2023)   Received from [facility] - Occupational Stress Questionnaire    Feeling of Stress : Very much  Social Connections: Somewhat Isolated (04/20/2023)   Received from [facility]   Social Network    How would you rate your social network (family, work, friends)?: Restricted participation with some degree of social isolation  Intimate Partner Violence: Not At Risk (02/10/2024)   Received from [facility]   HITS    Over the last 12 months how often did your partner physically hurt you?: Never    Over the last 12 months how often did your partner insult you or talk down to you?: Never    Over the last 12 months how often did your partner threaten you with physical harm?: Never    Over the last 12 months how often did your partner scream or curse at you?: Never     PHYSICAL EXAM  GENERAL EXAM/CONSTITUTIONAL: Vitals:  Vitals:   02/16/24 0938 02/16/24 0945  BP: (!) 165/90 (!) 158/84  Pulse: 70 60  Weight: 156 lb (70.8 kg)   Height: 5\' 5"  (1.651 m)    Body mass index is 25.96 kg/m. Wt  Readings from Last 3 Encounters:  02/16/24 156 lb (70.8 kg)   Patient is in no distress; well developed, nourished and groomed; neck is supple  CARDIOVASCULAR: Examination of carotid arteries is normal; no carotid bruits Regular rate and rhythm,  no murmurs Examination of peripheral vascular system by observation and palpation is normal  EYES: Ophthalmoscopic exam of optic discs and posterior segments is normal; no papilledema or hemorrhages No results found.  MUSCULOSKELETAL: Gait, strength, tone, movements noted in Neurologic exam below  NEUROLOGIC: MENTAL STATUS:      No data to display         awake, alert, oriented to person, place and time recent and remote memory intact normal attention and concentration language fluent, comprehension intact, naming intact fund of knowledge appropriate  CRANIAL NERVE:  2nd - no papilledema on fundoscopic exam 2nd, 3rd, 4th, 6th - pupils equal and reactive to light, visual fields full to confrontation, extraocular muscles intact, no nystagmus 5th - facial sensation symmetric 7th - facial strength symmetric 8th - hearing intact 9th - palate elevates symmetrically, uvula midline 11th - shoulder shrug symmetric 12th - tongue protrusion midline  MOTOR:  normal bulk and tone, full strength in the BUE, BLE  SENSORY:  normal and symmetric to light touch, temperature, vibration  COORDINATION:  finger-nose-finger, fine finger movements normal  REFLEXES:  deep tendon reflexes 1+ and symmetric  GAIT/STATION:  narrow based gait     DIAGNOSTIC DATA (LABS, IMAGING, TESTING) - I reviewed patient records, labs, notes, testing and imaging myself where available.  Lab Results  Component Value Date   WBC 6.2 11/06/2014   HGB 16.6 (H) 11/06/2014   HCT 49.0 (H) 11/06/2014   MCV 91.9 11/06/2014   PLT 214 11/06/2014      Component Value Date/Time   NA 138 11/06/2014 1031   K 4.0 11/06/2014 1031   CL 108 11/06/2014 1031   CO2  24 11/06/2014 1031   GLUCOSE 79 11/06/2014 1031   BUN 19 11/06/2014 1031   CREATININE 0.84 11/06/2014 1031   CALCIUM 8.9 11/06/2014 1031   PROT 6.9 11/06/2014 1031   ALBUMIN 3.9 11/06/2014 1031   AST 25 11/06/2014 1031   ALT 23 11/06/2014 1031   ALKPHOS 65 11/06/2014 1031   BILITOT 0.5 11/06/2014 1031   GFRNONAA 79 (L) 11/06/2014 1031   GFRAA >90 11/06/2014 1031   No results found for: "CHOL", "HDL", "LDLCALC", "LDLDIRECT", "TRIG", "CHOLHDL" No results found for: "HGBA1C" No results found for: "VITAMINB12" No results found for: "TSH"   02/10/24 CT head  - No acute intracranial abnormality.     ASSESSMENT AND PLAN  63 y.o. year old female here with:   Dx:  1. Left facial pain      PLAN:  LEFT FACIAL PAIN (since ~February 06, 2024) - check MRI brain, MRI CN5 protocol, CT maxillofacial, labs - continue CBZ 200mg  three times a day --> check labs, then consider adjusting dosing - check baclofen  5-10mg  three times a day as needed  Orders Placed This Encounter  Procedures   MR BRAIN W WO CONTRAST   CT MAXILLOFACIAL WO CONTRAST   MR FACE/TRIGEMINAL WO/W CM   Carbamazepine  level, total   Sedimentation Rate   C-reactive Protein   CBC with Differential/Platelet   Comprehensive metabolic panel with GFR    Meds ordered this encounter  Medications   baclofen  (LIORESAL ) 10 MG tablet    Sig: Take 0.5-1 tablets (5-10 mg total) by mouth 3 (three) times daily as needed for muscle spasms.    Dispense:  60 each    Refill:  3    Return in about 3 months (around 05/17/2024) for pending test results, MyChart visit (15 min).    Omega Bible, MD 02/16/2024,  3:43 PM Certified in Neurology, Neurophysiology and Neuroimaging  [facility] [address], [address] [place], [place] [address] [phone]

## 2024-02-17 ENCOUNTER — Encounter: Payer: Self-pay | Admitting: Diagnostic Neuroimaging

## 2024-02-17 LAB — CBC WITH DIFFERENTIAL/PLATELET
Basophils Absolute: 0 10*3/uL (ref 0.0–0.2)
Basos: 1 %
EOS (ABSOLUTE): 0.1 10*3/uL (ref 0.0–0.4)
Eos: 1 %
Hematocrit: 46.9 % — ABNORMAL HIGH (ref 34.0–46.6)
Hemoglobin: 15.1 g/dL (ref 11.1–15.9)
Immature Grans (Abs): 0 10*3/uL (ref 0.0–0.1)
Immature Granulocytes: 0 %
Lymphocytes Absolute: 1.4 10*3/uL (ref 0.7–3.1)
Lymphs: 22 %
MCH: 30.1 pg (ref 26.6–33.0)
MCHC: 32.2 g/dL (ref 31.5–35.7)
MCV: 93 fL (ref 79–97)
Monocytes Absolute: 0.6 10*3/uL (ref 0.1–0.9)
Monocytes: 8 %
Neutrophils Absolute: 4.5 10*3/uL (ref 1.4–7.0)
Neutrophils: 68 %
Platelets: 235 10*3/uL (ref 150–450)
RBC: 5.02 x10E6/uL (ref 3.77–5.28)
RDW: 12.8 % (ref 11.7–15.4)
WBC: 6.6 10*3/uL (ref 3.4–10.8)

## 2024-02-17 LAB — COMPREHENSIVE METABOLIC PANEL WITH GFR
ALT: 12 IU/L (ref 0–32)
AST: 15 IU/L (ref 0–40)
Albumin: 4.3 g/dL (ref 3.9–4.9)
Alkaline Phosphatase: 63 IU/L (ref 44–121)
BUN/Creatinine Ratio: 19 (ref 12–28)
BUN: 18 mg/dL (ref 8–27)
Bilirubin Total: 0.3 mg/dL (ref 0.0–1.2)
CO2: 22 mmol/L (ref 20–29)
Calcium: 9.1 mg/dL (ref 8.7–10.3)
Chloride: 104 mmol/L (ref 96–106)
Creatinine, Ser: 0.97 mg/dL (ref 0.57–1.00)
Globulin, Total: 2.5 g/dL (ref 1.5–4.5)
Glucose: 84 mg/dL (ref 70–99)
Potassium: 4.4 mmol/L (ref 3.5–5.2)
Sodium: 140 mmol/L (ref 134–144)
Total Protein: 6.8 g/dL (ref 6.0–8.5)
eGFR: 66 mL/min/{1.73_m2} (ref >59)

## 2024-02-17 LAB — CARBAMAZEPINE LEVEL, TOTAL: Carbamazepine (Tegretol), S: 9.1 ug/mL (ref 4.0–12.0)

## 2024-02-17 LAB — SEDIMENTATION RATE: Sed Rate: 2 mm/h (ref 0–40)

## 2024-02-17 LAB — C-REACTIVE PROTEIN: CRP: <1 mg/L (ref 0–10)

## 2024-02-17 MED ORDER — CARBAMAZEPINE ER 200 MG PO CP12: 400.0000 mg | ORAL_CAPSULE | Freq: Two times a day (BID) | ORAL | 6 refills | Status: DC

## 2024-02-17 NOTE — Telephone Encounter (Signed)
 I called patient to review results.  Patient continues to have facial pain issues.  Carbamazepine  level is 9.1.  She mentions she is also taking topiramate.  Will carefully increase the dose to extended release 40 mg twice a day to see if this helps with pain control.  If she experiences any additional side effects, then we will reduce the dosing back to 600 mg or less per day and try alternate medications (gabapentin, lyrica, lamotrigine, phenytoin).    Omega Bible, MD 02/17/2024, 1:40 PM Certified in Neurology, Neurophysiology and Neuroimaging  [facility] [address] [place], [place] [address] [phone]

## 2024-02-17 NOTE — Telephone Encounter (Signed)
 See message already started along with result note

## 2024-02-17 NOTE — Addendum Note (Signed)
 Addended by: Gwendloyn Lemming R on: 02/17/2024 10:33 AM   Modules accepted: Orders

## 2024-02-22 ENCOUNTER — Ambulatory Visit

## 2024-02-22 DIAGNOSIS — R519 Headache, unspecified: Secondary | ICD-10-CM | POA: Diagnosis not present

## 2024-02-22 MED ORDER — GADOBENATE DIMEGLUMINE 529 MG/ML IV SOLN
14.0000 mL | Freq: Once | INTRAVENOUS | Status: AC | PRN
  Administered 2024-02-22: 14 mL via INTRAVENOUS

## 2024-02-23 ENCOUNTER — Ambulatory Visit
Admission: RE | Admit: 2024-02-23 | Discharge: 2024-02-23 | Discharge status: Home / Self Care | Payer: Self-pay | Source: Ambulatory Visit | Attending: Diagnostic Neuroimaging | Admitting: Diagnostic Neuroimaging

## 2024-02-23 DIAGNOSIS — R519 Headache, unspecified: Secondary | ICD-10-CM

## 2024-03-06 ENCOUNTER — Encounter: Payer: Self-pay | Admitting: Diagnostic Neuroimaging

## 2024-03-07 ENCOUNTER — Ambulatory Visit: Payer: Self-pay | Admitting: Diagnostic Neuroimaging

## 2024-03-30 ENCOUNTER — Telehealth (INDEPENDENT_AMBULATORY_CARE_PROVIDER_SITE_OTHER): Admitting: Diagnostic Neuroimaging

## 2024-03-30 ENCOUNTER — Encounter: Payer: Self-pay | Admitting: Diagnostic Neuroimaging

## 2024-03-30 DIAGNOSIS — G43009 Migraine without aura, not intractable, without status migrainosus: Secondary | ICD-10-CM | POA: Diagnosis not present

## 2024-03-30 DIAGNOSIS — R519 Headache, unspecified: Secondary | ICD-10-CM

## 2024-03-30 NOTE — Patient Instructions (Signed)
 LEFT FACIAL PAIN (since ~February 06, 2024; now resolved) - tried carbamazepine  (had side effects); could try pregabalin, lamotrigine, baclofen , oxcarbazepine in future  MIGRAINE WITHOUT AURA - stable on topiramate 200mg  daily (caution with history of kidney stones)

## 2024-03-30 NOTE — Progress Notes (Signed)
 [facility]  PATIENT: [name] DOB: Sep 05, 1961  REFERRING CLINICIAN: Nash Bade, MD HISTORY FROM: patient  REASON FOR VISIT: follow up   HISTORICAL  CHIEF COMPLAINT:  Chief Complaint  Patient presents with   Facial Pain    HISTORY OF PRESENT ILLNESS:   UPDATE (03/30/24, VRP): Since last visit, tried high CBZ dosing 400mg  twice a day, but caused more side effects of nausea, and dizziness. Now off CBZ. Continues with several years of fatigue, lethargy and nausea.   PRIOR HPI (02/16/24, VRP): 62 year old female here for evaluation of left facial pain.  Symptom started on 02/06/24.  Having intermittent left temporal, left jaw and left ear pain with sharp sensations lasting a couple seconds at a time.  Associated with nausea, difficulty seeing.  Has been to PCP and started on carbamazepine  200mg  3 times a day with slight improvement.   REVIEW OF SYSTEMS: Full 14 system review of systems performed and negative with exception of: as per HPI.  ALLERGIES: Allergies  Allergen Reactions   Carbamazepine  Nausea Only   Morphine And Codeine    Sulfa Antibiotics     HOME MEDICATIONS: Outpatient Medications Prior to Visit  Medication Sig Dispense Refill   baclofen  (LIORESAL ) 10 MG tablet Take 0.5-1 tablets (5-10 mg total) by mouth 3 (three) times daily as needed for muscle spasms. 60 each 3   Biotin 16109 MCG TABS Take by mouth once.     buPROPion (WELLBUTRIN XL) 300 MG 24 hr tablet   3   Cholecalciferol (VITAMIN D) 1000 UNITS capsule Take 1,000 Units by mouth daily.     eletriptan (RELPAX) 40 MG tablet Take 40 mg by mouth as needed for migraine or headache. One tablet by mouth at onset of headache. May repeat in 2 hours if headache persists or recurs.     escitalopram (LEXAPRO) 10 MG tablet Take 10 mg by mouth daily.     estrogen, conjugated,-medroxyprogesterone (PREMPRO) 0.3-1.5 MG tablet Take 1 tablet by mouth daily.     ibuprofen (ADVIL,MOTRIN) 800 MG  tablet as needed.      simvastatin (ZOCOR) 20 MG tablet Take 20 mg by mouth daily.     topiramate (TOPAMAX) 200 MG tablet TAKE 1 TABLET(200 MG) BY MOUTH DAILY     vitamin E 400 UNIT capsule Take 800 Units by mouth daily.     carbamazepine  (CARBATROL ) 200 MG 12 hr capsule Take 2 capsules (400 mg total) by mouth 2 (two) times daily. 120 capsule 6   No facility-administered medications prior to visit.    PAST MEDICAL HISTORY: Past Medical History:  Diagnosis Date   Asthma     PAST SURGICAL HISTORY: Past Surgical History:  Procedure Laterality Date   ABLATION  2005   BREAST BIOPSY Left    BREAST REDUCTION SURGERY     COLONOSCOPY W/ POLYPECTOMY     COLPOSCOPY  01/25/2012   cin 2   endometrial biopsy     LEEP  02/15/2012   cin 2   TONSILLECTOMY     TUBAL LIGATION  2009   TUMOR REMOVAL Left    left arm    FAMILY HISTORY: Family History  Problem Relation Age of Onset   Heart disease Father    Cancer Maternal Grandfather    Heart disease Paternal Grandfather     SOCIAL HISTORY: Social History   Socioeconomic History   Marital status: Legally Separated    Spouse name: Not on file   Number of children: Not on file  Years of education: Not on file   Highest education level: Not on file  Occupational History   Not on file  Tobacco Use   Smoking status: Never   Smokeless tobacco: Not on file  Substance and Sexual Activity   Alcohol use: No   Drug use: No   Sexual activity: Yes    Partners: Male  Other Topics Concern   Not on file  Social History Narrative   Not on file   Social Drivers of Health   Financial Resource Strain: Low Risk  (01/27/2024)   Received from [facility]   Overall Financial Resource Strain (CARDIA)    Difficulty of Paying Living Expenses: Not very hard  Food Insecurity: No Food Insecurity (01/27/2024)   [facility]   Hunger Vital Sign    Worried About Running Out of Food in the Last Year: Never true    Ran Out of Food in the  Last Year: Never true  Transportation Needs: No Transportation Needs (01/27/2024)   Received from [facility] - Transportation    Lack of Transportation (Medical): No    Lack of Transportation (Non-Medical): No  Physical Activity: Unknown (04/20/2023)   Received from [facility]   Exercise Vital Sign    Days of Exercise per Week: 0 days    Minutes of Exercise per Session: Not on file  Stress: Stress Concern Present (04/20/2023)   Received from [facility] - Occupational Stress Questionnaire    Feeling of Stress : Very much  Social Connections: Somewhat Isolated (04/20/2023)   Received from [facility]   Social Network    How would you rate your social network (family, work, friends)?: Restricted participation with some degree of social isolation  Intimate Partner Violence: Not At Risk (02/10/2024)   Received from [facility]   HITS    Over the last 12 months how often did your partner physically hurt you?: Never    Over the last 12 months how often did your partner insult you or talk down to you?: Never    Over the last 12 months how often did your partner threaten you with physical harm?: Never    Over the last 12 months how often did your partner scream or curse at you?: Never     PHYSICAL EXAM  GENERAL EXAM/CONSTITUTIONAL: Vitals:  There were no vitals filed for this visit.  There is no height or weight on file to calculate BMI. Wt Readings from Last 3 Encounters:  02/16/24 156 lb (70.8 kg)   Patient is in no distress; well developed, nourished and groomed; neck is supple  CARDIOVASCULAR: Examination of carotid arteries is normal; no carotid bruits Regular rate and rhythm, no murmurs Examination of peripheral vascular system by observation and palpation is normal  EYES: Ophthalmoscopic exam of optic discs and posterior segments is normal; no papilledema or hemorrhages No results  found.  MUSCULOSKELETAL: Gait, strength, tone, movements noted in Neurologic exam below  NEUROLOGIC: MENTAL STATUS:      No data to display         awake, alert, oriented to person, place and time recent and remote memory intact normal attention and concentration language fluent, comprehension intact, naming intact fund of knowledge appropriate  CRANIAL NERVE:  2nd - no papilledema on fundoscopic exam 2nd, 3rd, 4th, 6th - pupils equal and reactive to light, visual fields full to confrontation, extraocular muscles intact, no nystagmus 5th - facial sensation  symmetric 7th - facial strength symmetric 8th - hearing intact 9th - palate elevates symmetrically, uvula midline 11th - shoulder shrug symmetric 12th - tongue protrusion midline  MOTOR:  normal bulk and tone, full strength in the BUE, BLE  SENSORY:  normal and symmetric to light touch, temperature, vibration  COORDINATION:  finger-nose-finger, fine finger movements normal  REFLEXES:  deep tendon reflexes 1+ and symmetric  GAIT/STATION:  narrow based gait     DIAGNOSTIC DATA (LABS, IMAGING, TESTING) - I reviewed patient records, labs, notes, testing and imaging myself where available.  Lab Results  Component Value Date   WBC 6.6 02/16/2024   HGB 15.1 02/16/2024   HCT 46.9 (H) 02/16/2024   MCV 93 02/16/2024   PLT 235 02/16/2024      Component Value Date/Time   NA 140 02/16/2024 1109   K 4.4 02/16/2024 1109   CL 104 02/16/2024 1109   CO2 22 02/16/2024 1109   GLUCOSE 84 02/16/2024 1109   GLUCOSE 79 11/06/2014 1031   BUN 18 02/16/2024 1109   CREATININE 0.97 02/16/2024 1109   CALCIUM 9.1 02/16/2024 1109   PROT 6.8 02/16/2024 1109   ALBUMIN 4.3 02/16/2024 1109   AST 15 02/16/2024 1109   ALT 12 02/16/2024 1109   ALKPHOS 63 02/16/2024 1109   BILITOT 0.3 02/16/2024 1109   GFRNONAA 79 (L) 11/06/2014 1031   GFRAA >90 11/06/2014 1031   No results found for: "CHOL", "HDL", "LDLCALC", "LDLDIRECT",  "TRIG", "CHOLHDL" No results found for: "HGBA1C" No results found for: "VITAMINB12" No results found for: "TSH"   02/10/24 CT head  - No acute intracranial abnormality.   02/23/24 CT maxillofacial 1. Normal for age noncontrast CT appearance of the Face. No explanation for pain. 2. 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  02/22/24 Normal MR face / trigeminal (with and without).   02/22/24 Normal MRI brain (with and without).     ASSESSMENT AND PLAN  63 y.o. year old female here with:  Has remote history of migraine headaches, managed with topiramate 200 mg daily. Has tried aimovig, emgality, botox, relpax, baclofen , flexeril, reglan.  Dx:  1. Left facial pain   2. Migraine without aura and without status migrainosus, not intractable      PLAN:  LEFT FACIAL PAIN (since ~February 06, 2024; now resolved) - tried carbamazepine  (had side effects); now stopped; fortunately symptoms are under control - could try pregabalin, lamotrigine, baclofen , oxcarbazepine in future if symptoms flare up  MIGRAINE WITHOUT AURA - stable on topiramate 200mg  daily (caution with history of kidney stones)  Return for pending if symptoms worsen or fail to improve.   Virtual Visit via Video Note  I connected with [place] Jest on 03/30/24 at  3:00 PM EDT by a video enabled telemedicine application and verified that I am speaking with the correct person using two identifiers.   I discussed the limitations of evaluation and management by telemedicine and the availability of in person appointments. The patient expressed understanding and agreed to proceed.  Patient is at home and I am at the office.   I spent 25 minutes of face-to-face and non-face-to-face time with patient.  This included previsit chart review, lab review, study review, order entry, electronic health record documentation, patient education.      Omega Bible, MD 03/30/2024, 3:44 PM Certified in Neurology,  Neurophysiology and Neuroimaging  [facility] [address], [address] [place], [place] [address] [phone]

## 2024-05-22 ENCOUNTER — Telehealth: Admitting: Diagnostic Neuroimaging

## 2024-09-27 ENCOUNTER — Telehealth: Payer: Self-pay | Admitting: Gastroenterology

## 2024-09-27 NOTE — Telephone Encounter (Signed)
 Inbound call from patient stating that she would like to schedule an appointment with Dr. Albertus. Patient is wishing to have done a EGD and colonoscopy due to her having HX of polyps and chronic IBS-C. Patient has recently being gain weight unexpectedly. Patient was last see in Aug the 25 of this year with one of the PA's that essential taken over Dr. Donnald office. Patient will try and send her OV notes over to our practice. Patient stated that she has not liked her care with the new doctor that took over the practice.

## 2024-10-05 ENCOUNTER — Other Ambulatory Visit: Payer: Self-pay

## 2024-10-05 ENCOUNTER — Ambulatory Visit
Admission: RE | Admit: 2024-10-05 | Discharge: 2024-10-05 | Disposition: A | Discharge status: Home / Self Care | Source: Ambulatory Visit

## 2024-10-05 DIAGNOSIS — R197 Diarrhea, unspecified: Secondary | ICD-10-CM
